# Patient Record
Sex: Male | Born: 1995 | Race: Black or African American | Hispanic: No | Marital: Single
Health system: Southern US, Community
[De-identification: ages and names within clinical notes are randomized; demographics above are authoritative.]

---

## 2019-01-16 ENCOUNTER — Other Ambulatory Visit: Payer: Self-pay

## 2019-01-16 ENCOUNTER — Encounter (HOSPITAL_COMMUNITY): Payer: Self-pay

## 2019-01-16 DIAGNOSIS — R0989 Other specified symptoms and signs involving the circulatory and respiratory systems: Secondary | ICD-10-CM | POA: Diagnosis not present

## 2019-01-16 DIAGNOSIS — J219 Acute bronchiolitis, unspecified: Secondary | ICD-10-CM | POA: Insufficient documentation

## 2019-01-16 DIAGNOSIS — R0602 Shortness of breath: Secondary | ICD-10-CM | POA: Diagnosis present

## 2019-01-16 NOTE — ED Triage Notes (Signed)
Pt reports SOB and feeling "tight" in his chest. No wheezing. Denies any exposure to COVID, no pain, and no cough. He states that he thinks that he is having a "panic attack." Speaking in complete sentences without respiratory distress.

## 2019-01-17 ENCOUNTER — Emergency Department (HOSPITAL_COMMUNITY)
Admission: EM | Admit: 2019-01-17 | Discharge: 2019-01-17 | Disposition: A | Discharge status: Home / Self Care | Payer: BLUE CROSS/BLUE SHIELD | Attending: Emergency Medicine | Admitting: Emergency Medicine

## 2019-01-17 ENCOUNTER — Emergency Department (HOSPITAL_COMMUNITY): Payer: BLUE CROSS/BLUE SHIELD

## 2019-01-17 ENCOUNTER — Emergency Department (HOSPITAL_COMMUNITY)
Admission: EM | Admit: 2019-01-17 | Discharge: 2019-01-17 | Disposition: A | Discharge status: Home / Self Care | Payer: BC Managed Care – PPO | Attending: Emergency Medicine | Admitting: Emergency Medicine

## 2019-01-17 ENCOUNTER — Encounter (HOSPITAL_COMMUNITY): Payer: Self-pay

## 2019-01-17 DIAGNOSIS — R0989 Other specified symptoms and signs involving the circulatory and respiratory systems: Secondary | ICD-10-CM | POA: Insufficient documentation

## 2019-01-17 DIAGNOSIS — R1110 Vomiting, unspecified: Secondary | ICD-10-CM | POA: Diagnosis not present

## 2019-01-17 DIAGNOSIS — J9801 Acute bronchospasm: Secondary | ICD-10-CM

## 2019-01-17 DIAGNOSIS — J029 Acute pharyngitis, unspecified: Secondary | ICD-10-CM | POA: Diagnosis present

## 2019-01-17 DIAGNOSIS — R0602 Shortness of breath: Secondary | ICD-10-CM | POA: Diagnosis not present

## 2019-01-17 DIAGNOSIS — R6889 Other general symptoms and signs: Secondary | ICD-10-CM

## 2019-01-17 MED ORDER — ALBUTEROL SULFATE HFA 108 (90 BASE) MCG/ACT IN AERS
2.0000 | INHALATION_SPRAY | Freq: Once | RESPIRATORY_TRACT | Status: AC
  Administered 2019-01-17: 02:00:00 2 via RESPIRATORY_TRACT
  Filled 2019-01-17: qty 6.7

## 2019-01-17 MED ORDER — HYDROXYZINE HCL 25 MG PO TABS
25.0000 mg | ORAL_TABLET | Freq: Once | ORAL | Status: AC
  Administered 2019-01-17: 25 mg via ORAL
  Filled 2019-01-17: qty 1

## 2019-01-17 MED ORDER — PREDNISONE 50 MG PO TABS: 50.0000 mg | ORAL_TABLET | Freq: Every day | ORAL | 0 refills | Status: AC

## 2019-01-17 MED ORDER — PREDNISONE 20 MG PO TABS
60.0000 mg | ORAL_TABLET | Freq: Once | ORAL | Status: AC
  Administered 2019-01-17: 60 mg via ORAL
  Filled 2019-01-17: qty 3

## 2019-01-17 NOTE — ED Provider Notes (Signed)
[facility] DEPT Provider Note   CSN: [id number] Arrival date & time: 01/17/19  0510     History   Chief Complaint Chief Complaint  Patient presents with  . Sore Throat    HPI Jason Short. is a 23 y.o. male.     Patient returns to ED after evaluation earlier this evening for chest pressure and tightness that he felt affected his breathing. No fever, cough, vomiting, pain. He was treated with prednisone and Albuterol inhaler and discharged home. After getting home he vomited x 1 and then started to experience tightness in his throat. He denies increased SOB, and denies difficulty swallowing. He was afraid he vomited the prednisone and needed further evaluation.   The history is provided by the patient. No language interpreter was used.  Sore Throat Associated symptoms include shortness of breath (See HPI.).    History reviewed. No pertinent past medical history.  There are no active problems to display for this patient.   History reviewed. No pertinent surgical history.      Home Medications    Prior to Admission medications   Medication Sig Start Date End Date Taking? Authorizing Provider  predniSONE (DELTASONE) 50 MG tablet Take 1 tablet (50 mg total) by mouth daily. 09/03/79  Yes Delora Fuel, MD  Pseudoephedrine-APAP-DM [phone] MG CAPS Take 2 capsules by mouth every 6 (six) hours as needed (cough).   Yes [provider]    Family History History reviewed. No pertinent family history.  Social History Social History   Tobacco Use  . Smoking status: Not on file  Substance Use Topics  . Alcohol use: Not on file  . Drug use: Not on file     Allergies   Patient has no known allergies.   Review of Systems Review of Systems  Constitutional: Negative for chills and fever.  HENT:       See HPI.  Respiratory: Positive for shortness of breath (See HPI.).   Cardiovascular: Negative.   Gastrointestinal:  Positive for vomiting (x 1 at home). Negative for nausea.  Musculoskeletal: Negative.   Skin: Negative.   Neurological: Negative.      Physical Exam Updated Vital Signs BP (!) 146/72 (BP Location: Right Arm)   Pulse 66   Temp 98.2 F (36.8 C) (Oral)   Resp 18   SpO2 97%   Physical Exam Vitals signs and nursing note reviewed.  Constitutional:      Appearance: He is well-developed.  HENT:     Head: Normocephalic.     Mouth/Throat:     Mouth: Mucous membranes are moist.     Pharynx: Uvula midline.  Neck:     Musculoskeletal: Normal range of motion and neck supple.  Cardiovascular:     Rate and Rhythm: Normal rate and regular rhythm.     Heart sounds: No murmur.  Pulmonary:     Effort: Pulmonary effort is normal.     Breath sounds: Normal breath sounds. No wheezing, rhonchi or rales.  Abdominal:     General: Bowel sounds are normal.     Palpations: Abdomen is soft.     Tenderness: There is no abdominal tenderness. There is no guarding or rebound.  Musculoskeletal: Normal range of motion.  Skin:    General: Skin is warm and dry.     Findings: No rash.  Neurological:     Mental Status: He is alert.     Cranial Nerves: No cranial nerve deficit.  ED Treatments / Results  Labs (all labs ordered are listed, but only abnormal results are displayed) Labs Reviewed - No data to display  EKG None  Radiology Dg Chest 2 View  Result Date: 01/17/2019 CLINICAL DATA:  Initial evaluation for acute shortness of breath. EXAM: CHEST - 2 VIEW COMPARISON:  None available. FINDINGS: The cardiac and mediastinal silhouettes are within normal limits. The lungs are normally inflated. No airspace consolidation, pleural effusion, or pulmonary edema is identified. There is no pneumothorax. No acute osseous abnormality identified. IMPRESSION: No active cardiopulmonary disease. Electronically Signed   By: Rise MuBenjamin  McClintock M.D.   On: 01/17/2019 02:21    Procedures Procedures  (including critical care time)  Medications Ordered in ED Medications  hydrOXYzine (ATARAX/VISTARIL) tablet 25 mg (25 mg Oral Given 01/17/19 0620)     Initial Impression / Assessment and Plan / ED Course  I have reviewed the triage vital signs and the nursing notes.  Pertinent labs & imaging results that were available during my care of the patient were reviewed by me and considered in my medical decision making (see chart for details).        Patient to ED with sensation of fullness in his throat. Seen earlier this evening and given prednisone and albuterol for chest tightness. No rash.   The patient is well appearing. He is given hydroxyzine  in the ED with little improvement. O2 saturation normal. He is drinking water without difficulty swallowing. The patient is reassured. Feel he can be discharged home with return precautions.   Final Clinical Impressions(s) / ED Diagnoses   Final diagnoses:  None   1. Throat fullness   ED Discharge Orders    None       Elpidio AnisUpstill, Alfio Loescher, Cordelia Poche-C 01/17/19 [phone]    Dione BoozeGlick, David, MD 01/17/19 [phone]

## 2019-01-17 NOTE — ED Triage Notes (Signed)
Pt reports throat discomfort and a feeling of swelling after receiving prednisone about an hour ago. Pt also reports that he has been burping a lot. No distress. Dr Roxanne Mins made aware.

## 2019-01-17 NOTE — Discharge Instructions (Addendum)
Continue with taking the medications as prescribed on earlier visit tonight.   Return to the emergency department with any new or concerning symptoms.

## 2019-01-17 NOTE — ED Triage Notes (Signed)
Pt comes to ed via pov, pt states he may have vomited his medication prednisone from before when he got home. 2 episodes and continues to burp he says.  Pt says his throat feels like he " throat is swollen and his air flow feels resistance." No signs of distress.

## 2019-01-17 NOTE — ED Provider Notes (Signed)
[facility] DEPT Provider Note   CSN: [id number] Arrival date & time: 01/16/19  2144    History   Chief Complaint Chief Complaint  Patient presents with   Shortness of Breath    HPI Jason Short. is a 23 y.o. male.   The history is provided by the patient.  Shortness of Breath He states that for about the past week he has been having a pressure feeling on his chest.  Today, he had an episode where he felt like he could not breathe but thinks that this was a panic attack.  He denies any chest pain per se.  He denies cough, nausea, vomiting, diarrhea, diaphoresis, fever, chills.  He has not done anything to treat this.  Nothing makes it better, nothing makes it worse.  He is a non-smoker and denies drug use.  He denies history of diabetes, hypertension, hyperlipidemia and denies family history of premature coronary atherosclerosis.  He has had no recent trauma, recent travel, recent surgery and has no history of cancer.  No use of exogenous estrogens.  History reviewed. No pertinent past medical history.  There are no active problems to display for this patient.   ** The histories are not reviewed yet. Please review them in the "History" navigator section and refresh this [place].      Home Medications    Prior to Admission medications   Medication Sig Start Date End Date Taking? Authorizing Provider  Pseudoephedrine-APAP-DM 30-325-15 MG CAPS Take 2 capsules by mouth every 6 (six) hours as needed (cough).   Yes [provider]    Family History History reviewed. No pertinent family history.  Social History Social History   Tobacco Use   Smoking status: Not on file  Substance Use Topics   Alcohol use: Not on file   Drug use: Not on file     Allergies   Patient has no known allergies.   Review of Systems Review of Systems  Respiratory: Positive for shortness of breath.   All other systems reviewed and are  negative.    Physical Exam Updated Vital Signs BP (!) 160/88 (BP Location: Right Arm)    Pulse 69    Temp 98.6 F (37 C) (Oral)    Resp 14    Ht 5\' 10"  (1.778 m)    Wt 92.6 kg    SpO2 99%    BMI 29.30 kg/m   Physical Exam Vitals signs and nursing note reviewed.    23 year old male, resting comfortably and in no acute distress. Vital signs are significant for elevated blood pressure. Oxygen saturation is 99%, which is normal. Head is normocephalic and atraumatic. PERRLA, EOMI. Oropharynx is clear. Neck is nontender and supple without adenopathy or JVD. Back is nontender and there is no CVA tenderness. Lungs are clear without rales, wheezes, or rhonchi. Chest is nontender. Heart has regular rate and rhythm without murmur. Abdomen is soft, flat, nontender without masses or hepatosplenomegaly and peristalsis is normoactive. Extremities have no cyanosis or edema, full range of motion is present. Skin is warm and dry without rash. Neurologic: Mental status is normal, cranial nerves are intact, there are no motor or sensory deficits.  ED Treatments / Results  Labs (all labs ordered are listed, but only abnormal results are displayed) Labs Reviewed - No data to display  EKG EKG Interpretation  Date/Time:  Sunday January 17 2019 01:11:01 EDT Ventricular Rate:  61 PR Interval:    QRS Duration: 94  QT Interval:  412 QTC Calculation: 415 R Axis:   83 Text Interpretation:  Sinus rhythm ST elev, probable normal early repol pattern No old tracing to compare Confirmed by Dione BoozeGlick, Reagan Behlke [phone]([id number]) on 01/17/2019 1:47:27 AM   Radiology Dg Chest 2 View  Result Date: 01/17/2019 CLINICAL DATA:  Initial evaluation for acute shortness of breath. EXAM: CHEST - 2 VIEW COMPARISON:  None available. FINDINGS: The cardiac and mediastinal silhouettes are within normal limits. The lungs are normally inflated. No airspace consolidation, pleural effusion, or pulmonary edema is identified. There is no  pneumothorax. No acute osseous abnormality identified. IMPRESSION: No active cardiopulmonary disease. Electronically Signed   By: Rise MuBenjamin  McClintock M.D.   On: 01/17/2019 02:21    Procedures Procedures  Medications Ordered in ED Medications  predniSONE (DELTASONE) tablet 60 mg (has no administration in time range)  albuterol (VENTOLIN HFA) 108 (90 Base) MCG/ACT inhaler 2 puff (2 puffs Inhalation Given 01/17/19 0154)     Initial Impression / Assessment and Plan / ED Course  I have reviewed the triage vital signs and the nursing notes.  Pertinent labs & imaging results that were available during my care of the patient were reviewed by me and considered in my medical decision making (see chart for details).  Chest discomfort of uncertain cause.  Exam is benign.  He has [facility] prior [facility].  He will be given a therapeutic trial of albuterol to see if this is actually bronchospasm.  Will check chest x-ray and ECG.  D-dimer is not necessary because he is negative for pulmonary embolism by PERC criteria and Wells criteria.  ECG is normal with presence of early repolarization.  Chest x-ray is unremarkable.  He had excellent symptomatic relief with albuterol inhaler.  This appears to be bronchospasm.  He is given a dose of prednisone and discharged with prescription for prednisone.  He is given an inhaler to take home and use every 4 hours as needed.  Return precautions discussed.  Final Clinical Impressions(s) / ED Diagnoses   Final diagnoses:  Bronchospasm    ED Discharge Orders         Ordered    predniSONE (DELTASONE) 50 MG tablet  Daily     01/17/19 0302           Dione BoozeGlick, Jaydence Vanyo, MD 01/17/19 [phone]

## 2019-01-17 NOTE — Discharge Instructions (Addendum)
Use the inhaler, two puffs every four hours, as needed.

## 2020-08-27 IMAGING — CR CHEST - 2 VIEW
2 series · 2 of 2 positions shown · non-contrast
Comparison: None available.

CLINICAL DATA: Initial evaluation for acute shortness of breath.

EXAM:
CHEST - 2 VIEW

[w chest pa]
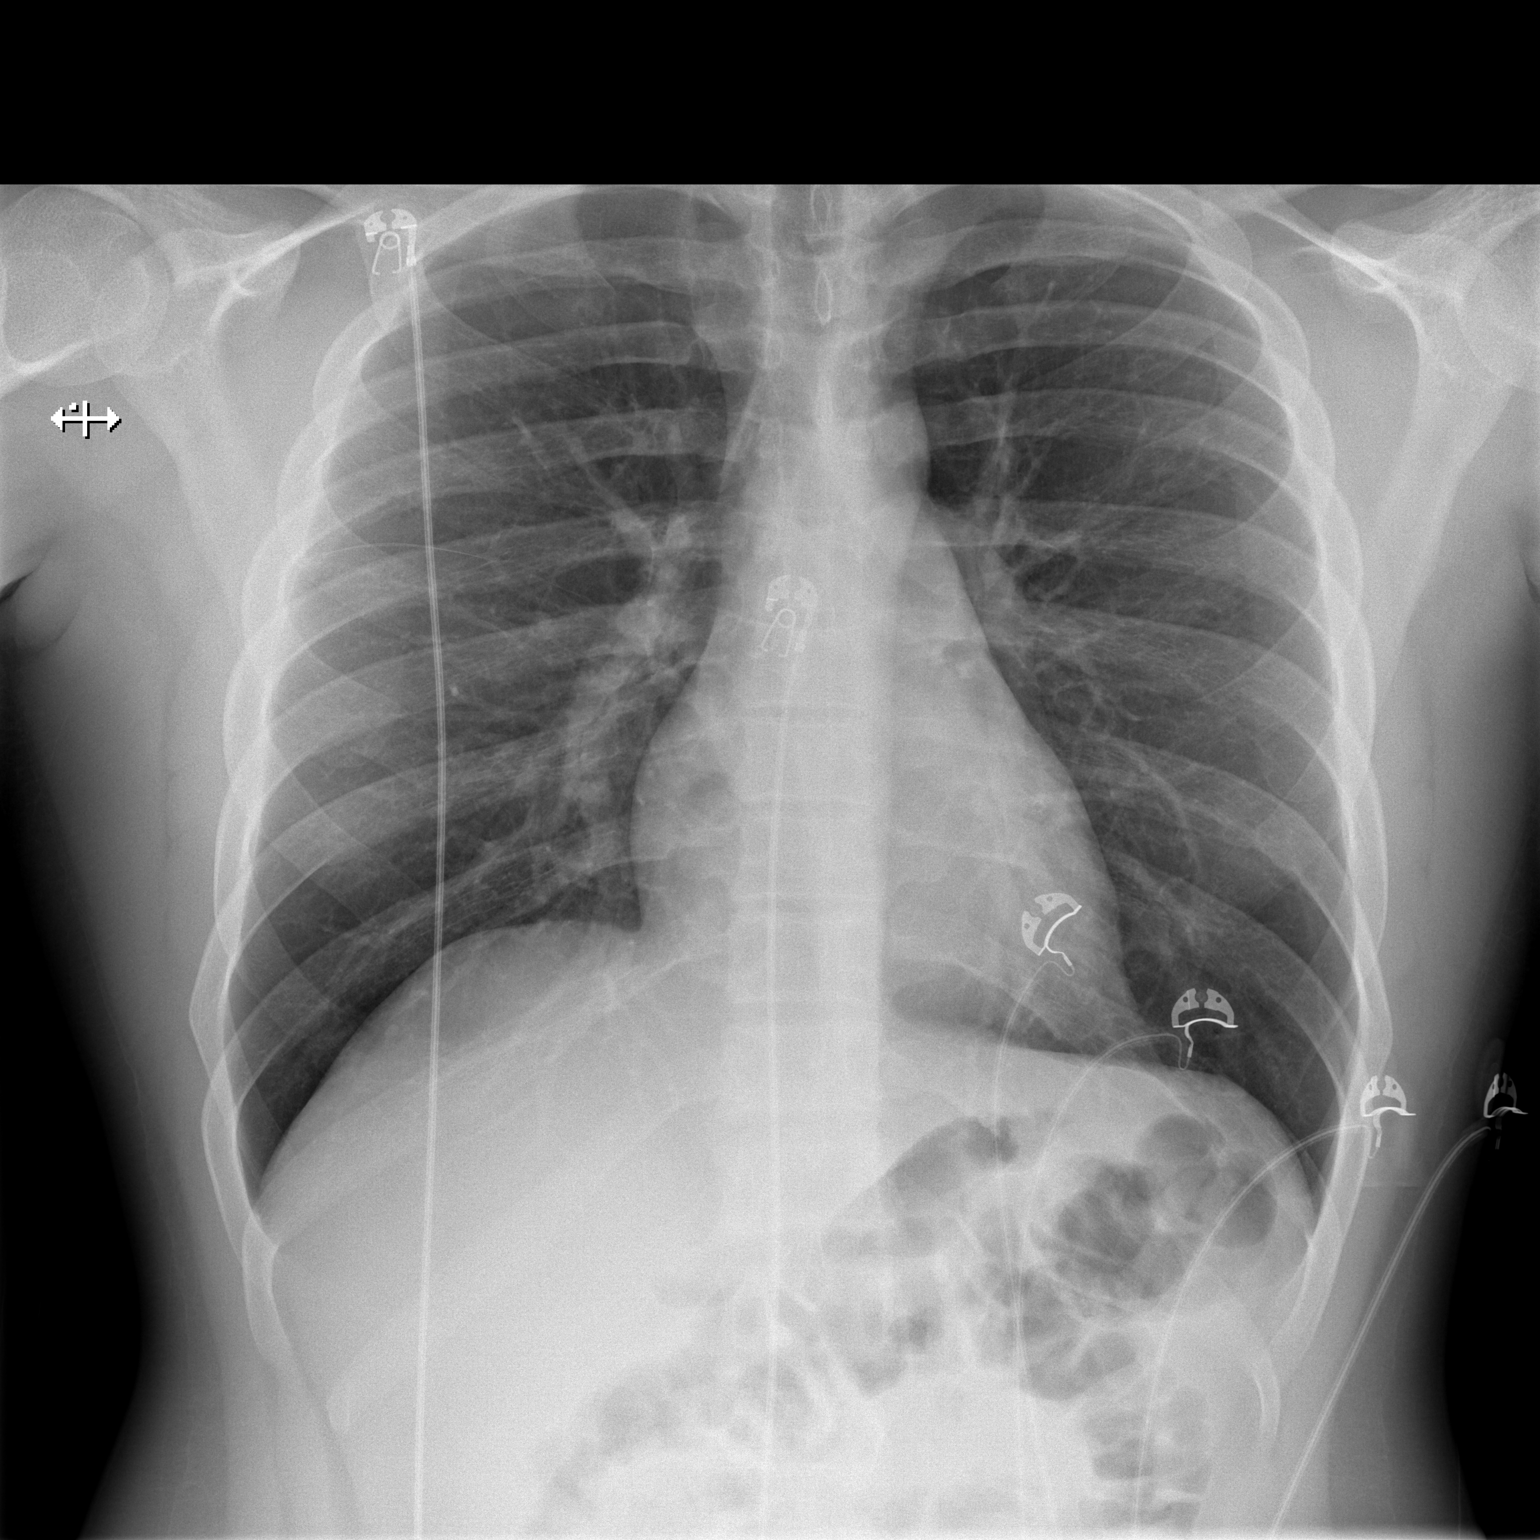

[w chest lat]
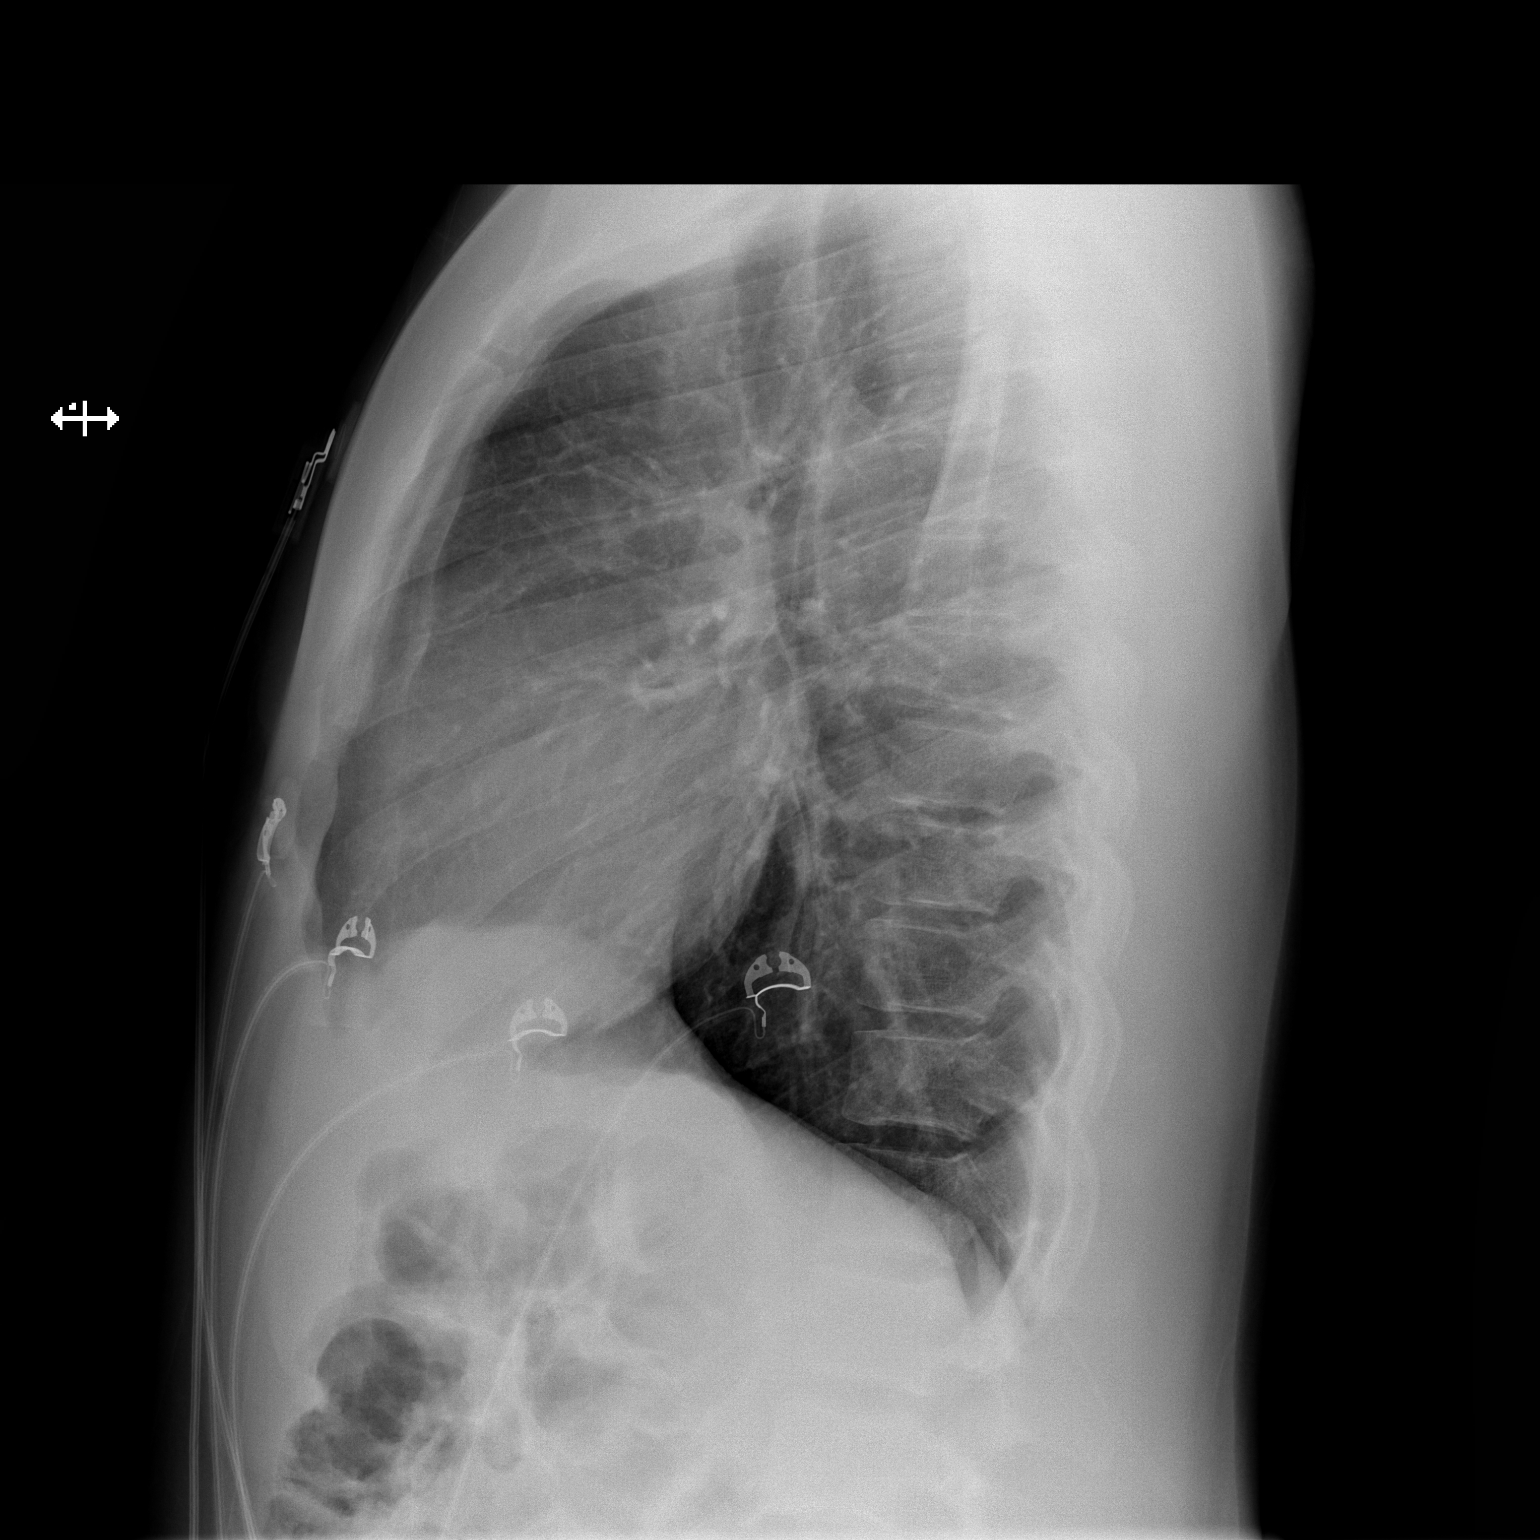

[2 of 2 positions shown; findings below may reference images not displayed]

FINDINGS: The cardiac and mediastinal silhouettes are within normal limits.

The lungs are normally inflated. No airspace consolidation, pleural
effusion, or pulmonary edema is identified. There is no
pneumothorax.

No acute osseous abnormality identified.
IMPRESSION: No active cardiopulmonary disease.
